# Patient Record
Sex: Male | Born: 1985 | Race: Black or African American | Hispanic: No | Marital: Single | Smoking: Current every day smoker
Health system: Southern US, Community
[De-identification: ages and names within clinical notes are randomized; demographics above are authoritative.]

## PROBLEM LIST (undated history)

## (undated) DIAGNOSIS — J45909 Unspecified asthma, uncomplicated: Secondary | ICD-10-CM

## (undated) DIAGNOSIS — L519 Erythema multiforme, unspecified: Secondary | ICD-10-CM

## (undated) DIAGNOSIS — R569 Unspecified convulsions: Secondary | ICD-10-CM

---
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
## 2006-11-20 ENCOUNTER — Emergency Department (HOSPITAL_COMMUNITY): Admission: EM | Admit: 2006-11-20 | Discharge: 2006-11-20 | Payer: Self-pay | Admitting: Emergency Medicine

## 2012-05-09 ENCOUNTER — Emergency Department (HOSPITAL_COMMUNITY)
Admission: EM | Admit: 2012-05-09 | Discharge: 2012-05-09 | Disposition: A | Discharge status: Home / Self Care | Payer: Medicaid Other | Attending: Emergency Medicine | Admitting: Emergency Medicine

## 2012-05-09 ENCOUNTER — Encounter (HOSPITAL_COMMUNITY): Payer: Self-pay | Admitting: Nurse Practitioner

## 2012-05-09 DIAGNOSIS — R21 Rash and other nonspecific skin eruption: Secondary | ICD-10-CM

## 2012-05-09 HISTORY — DX: Unspecified convulsions: R56.9

## 2012-05-09 HISTORY — DX: Unspecified asthma, uncomplicated: J45.909

## 2012-05-09 MED ORDER — DIPHENHYDRAMINE HCL 25 MG PO CAPS
25.0000 mg | ORAL_CAPSULE | Freq: Four times a day (QID) | ORAL | Status: DC | PRN
  Administered 2012-05-09: 25 mg via ORAL
  Filled 2012-05-09: qty 1

## 2012-05-09 MED ORDER — IBUPROFEN 400 MG PO TABS: 400.0000 mg | ORAL_TABLET | Freq: Once | ORAL | Status: DC

## 2012-05-09 MED ORDER — HYDROXYZINE HCL 25 MG PO TABS: 25.0000 mg | ORAL_TABLET | Freq: Four times a day (QID) | ORAL | Status: DC

## 2012-05-09 MED ORDER — ACETAMINOPHEN 325 MG PO TABS
650.0000 mg | ORAL_TABLET | Freq: Once | ORAL | Status: AC
  Administered 2012-05-09: 650 mg via ORAL
  Filled 2012-05-09: qty 2

## 2012-05-09 NOTE — Discharge Instructions (Signed)
Rash A rash is a change in the color or feel of your skin. There are many different types of rashes. You may have other problems along with your rash. HOME CARE  Avoid the thing that caused your rash.   Do not scratch your rash.   You may take cools baths to help stop itching.   Only take medicines as told by your doctor.   Keep all doctor visits as told.  GET HELP RIGHT AWAY IF:   Your pain, puffiness (swelling), or redness gets worse.   You have a fever.   You have new or severe problems.   You have body aches, watery poop (diarrhea), or you throw up (vomit).   Your rash is not better after 3 days.  MAKE SURE YOU:   Understand these instructions.   Will watch your condition.   Will get help right away if you are not doing well or get worse.  Document Released: 04/03/2008 Document Revised: 10/05/2011 Document Reviewed: 07/31/2011 [facility] Patient Information 2012 [place], [place].  RESOURCE GUIDE  Chronic Pain Problems: Contact [facility]  [phone] Patients need to be referred by their primary care doctor.  Insufficient Money for Medicine: Contact United Way:  call "211" or [facility] [phone].  No Primary Care Doctor: - Call Health Connect  [phone] - can help you locate a primary care doctor that  accepts your insurance, provides certain services, etc. - Physician Referral Service- [phone]  Agencies that provide inexpensive medical care: - [facility]  [phone] - [facility]  [phone] - [facility]  [phone] [facility]  [phone] - Planned Parenthood  [phone] [facility]  [phone]  Medicaid-accepting [facility] Providers: - [facility]- [address]  [phone], Mon-Fri 9am-7pm, Sat 9am-1pm - [facility]- [address] [place], [address] [place]  [phone] - [facility]- [address] [place]  [phone] [facility]- [address]  [phone] - Renaye Rakers- [address] [place], [address], [phone]  Only accepts [place] patients after they have their name  applied to their card  Self Pay (no insurance) in [place]: - Sickle Cell Patients: Dr Willey Blade, [facility]  [address] [place], [phone] - [facility]- [address] [place]  [phone]       [facility] [place]- [address]       -     [facility]- see information above (Speak to [employer] if you do not have insurance)       -  [facility]- [address] [place], [phone]       -  [facility]- [address],  [phone]       -  [facility]- [address], [phone]       -  Dr Julio Sicks-  [address], [place], [phone]       -  [facility]- [address], [phone]       -  [facility]- [address] [phone], also [address] [phone]       -    [facility]- [address] [place], [phone], 1st & 3rd Saturday  every month, 10am-1pm  1) Find a Doctor and Pay Out of Pocket Although you won't have to find out who is covered by your insurance plan, it is a good idea to ask around and get recommendations. You will then need to call the office and see if the doctor you have chosen will accept you as a new patient and what types of options they offer for patients who are self-pay. Some doctors offer discounts or will set up payment plans for their patients who do not have insurance, but you will need to ask so you aren't surprised when you get to your appointment.  2) Contact Your Local Health Department Not all health departments have doctors that can see patients for sick visits, but many do, so it is worth a call to see if yours does. If you don't know where your local health department is, you  can check in your phone book. The CDC also has a tool to help you locate your state's health department, and many state websites also have listings of all of their local health departments.  3) Find a Walk-in Clinic If your illness is not likely to be very severe or complicated, you may want to try a walk in clinic. These are popping up all over the country in pharmacies, drugstores, and shopping centers. They're usually staffed by nurse practitioners or physician assistants that have been trained to treat common illnesses and complaints. They're usually fairly quick and inexpensive. However, if you have serious medical issues or chronic medical problems, these are probably not your best option  STD Testing - [facility] Department of [facility] [place], STD Clinic, [address], [place], phone [phone] or [phone].  Monday - Friday, call for an appointment. [facility] Department of [employer], STD Clinic, [address], [place], phone [phone] or [phone].  Monday - Friday, call for an appointment.  Abuse/Neglect: [facility] Child Abuse Hotline 703-775-9811 [facility] Child Abuse Hotline 301 619 4996 (After Hours)  Emergency Shelter:  [facility] 612-450-9863  Maternity Homes: - [facility] (236)315-1749 - [facility] (847)[phone]  MRSA Hotline #:   2017193102  [facility] Resources  [facility] [place]  United Way [facility]. [address]                 [address]         [address] [place] [address]  Blondell Reveal Phone:  [phone]                                  Phone:  [phone]                   Phone:  [phone]  [facility], [phone] - [facility] [employer]864 [phone]        -  [facility] in [place], [address],                                  [phone], [facility] Child Abuse Hotline 214-048-9547 or [phone] (After Hours)   Behavioral Health Services  Substance Abuse Resources: - [facility] and Drug Services  (917)431-0491 - [facility] ([phone] - [facility] [place] [phone] Floydene Flock (201)398-2883 - Residential & Outpatient Substance Abuse Program  [phone]  Psychological Services: [facility]  [phone] [facility]  [phone] - [facility], [phone] [place]. [address] [place], ACCESS LINE: (442)690-7225 or [phone], [employer]za  Dental Assistance  If unable to pay or uninsured, contact:  [facility] or [facility]. to become qualified for the adult dental clinic.  Patients with Medicaid: [facility] [phone] [address], [phone] [address], [phone]  If unable to pay, or uninsured, contact [facility] [phone]) or [facility] Department [phone] in [place], [phone] in [facility]) to become qualified for the adult dental clinic  Other Low-Cost Community Dental Services: - [facility]- [address] [place], [place], [place], [address], [phone], [address]. [phone], 2nd and 4th Thursday of the month at 6:30am.  10 clients each day by appointment, can sometimes see walk-in patients if someone does not show for an appointment. [facility]- [address] [place], [place], [address], [phone] - [facility]- [address], [place], [place], [address], [phone] - [place]- [phone] [facility] Health Department- [phone] [facility] Department- [phone]

## 2012-05-09 NOTE — ED Notes (Signed)
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

## 2012-05-09 NOTE — ED Provider Notes (Signed)
History   This chart was scribed for Virgel Manifold, MD by Marin Comment . The patient was seen in room [facility].    CSN: [id number]  Arrival date & time 05/09/12  1148   First MD Initiated Contact with Patient 05/09/12 1157      Chief Complaint  Patient presents with  . Rash    (Consider location/radiation/quality/duration/timing/severity/associated sxs/prior treatment) HPI Randall Wagner is a 26 y.o. male who presents to the Emergency Department complaining of constant, moderate rash to his hands, feet, legs and knees bilaterally for the past 4 days. Patient states that his symptoms are worsening. Patient states that the rash has been intermittent for the past 3 weeks ago, with the rash subsided and then returning. 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 Pt states that he is suually treated with steroids and abx. Pt reports associated pain, nausea, chills, and diaphoresis. Patient denies any fevers. Pt denies any sores in mouth or eyes. Patient report a h/o similar symptoms and states that today's flare up is typical for his rash. Pt denies any new medications or recent abx. Pt states that his PCP had him on Vicodin for his symptoms but denies taking anything for pain recently.    Past Medical History  Diagnosis Date  . Seizures   . Asthma     History reviewed. No pertinent past surgical history.  History reviewed. No pertinent family history.  History  Substance Use Topics  . Smoking status: Never Smoker   . Smokeless tobacco: Not on file  . Alcohol Use: No      Review of Systems  Skin: Positive for rash.    Allergies  Review of patient's allergies indicates no known allergies.  Home Medications  No current outpatient prescriptions on file.  BP 142/87  Pulse 80  Temp 98.2 F (36.8 C) (Oral)  Resp 18  SpO2 100%  Physical Exam  Nursing note and vitals reviewed. Constitutional: He is oriented to person, place, and time. He  appears well-developed and well-nourished. No distress.  HENT:  Head: Normocephalic and atraumatic.  Eyes: EOM are normal.  Neck: Neck supple. No tracheal deviation present.  Cardiovascular: Normal rate, regular rhythm and normal heart sounds.   No murmur heard. Pulmonary/Chest: Effort normal and breath sounds normal. No respiratory distress. He has no wheezes.  Abdominal: Soft. Bowel sounds are normal. He exhibits no distension. There is no tenderness.  Musculoskeletal: Normal range of motion.  Neurological: He is alert and oriented to person, place, and time.  Skin: Skin is warm and dry. Rash noted.       No mucosal or eye lesions noted. Small roughly circular scattered lesions to b/l knees and shins and dorsum or R hand. Slightly raised. Nontender. No skin sloughing. Neg niklosky. NO drainage. NO evidence of cellulits.  Psychiatric: He has a normal mood and affect. His behavior is normal.    ED Course  Procedures (including critical care time)  DIAGNOSTIC STUDIES: Oxygen Saturation is 100% on room air, normal by my interpretation.    COORDINATION OF CARE:  1221: Discussed planned course of treatment with the patient, who is agreeable at this time.  1221: Medication Orders: Diphenhydramine (Benadryl) capsule 25 mg every 6 hours PRN 1230: Medication Orders: Ibuprofen (Advil, Motrin) tablet 400 mg-once.    Labs Reviewed - No data to display No results found.   1. Rash       MDM  26 year old male with rash. Gradual onset approximately 3  weeks ago. 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 Morphology of rash is consistent with this. Evidence of prior, well-healed lesions. There is no mucous membrane involvement. No ocular involvement. No recent viral symptoms prior to onset. No new medications. Plan symptomatic treatment at this time. The lesions do not appear secondarily infected. Without evidence of significant skin involvement, mucus mucous membrane involvement or  ocular do not feel that steroids or antivirals indicated at this time. Pt advised to acoid NSAID use for pain. Recommended tylenol. Pt reports previously prescribed vicodin. Given that likely benign self limited course, I do not feel requires narcotics. Return precautions were discussed.  I personally preformed the services scribed in my presence. The recorded information has been reviewed and considered. Virgel Manifold, MD.        Virgel Manifold, MD 05/09/12 [phone]

## 2012-05-10 ENCOUNTER — Emergency Department (HOSPITAL_COMMUNITY)
Admission: EM | Admit: 2012-05-10 | Discharge: 2012-05-10 | Disposition: A | Discharge status: Home / Self Care | Payer: Medicaid Other | Attending: Emergency Medicine | Admitting: Emergency Medicine

## 2012-05-10 ENCOUNTER — Emergency Department (HOSPITAL_COMMUNITY): Payer: Medicaid Other

## 2012-05-10 ENCOUNTER — Encounter (HOSPITAL_COMMUNITY): Payer: Self-pay | Admitting: Emergency Medicine

## 2012-05-10 DIAGNOSIS — K644 Residual hemorrhoidal skin tags: Secondary | ICD-10-CM | POA: Insufficient documentation

## 2012-05-10 DIAGNOSIS — R10819 Abdominal tenderness, unspecified site: Secondary | ICD-10-CM | POA: Insufficient documentation

## 2012-05-10 DIAGNOSIS — R109 Unspecified abdominal pain: Secondary | ICD-10-CM | POA: Insufficient documentation

## 2012-05-10 DIAGNOSIS — J45909 Unspecified asthma, uncomplicated: Secondary | ICD-10-CM | POA: Insufficient documentation

## 2012-05-10 DIAGNOSIS — K6289 Other specified diseases of anus and rectum: Secondary | ICD-10-CM | POA: Insufficient documentation

## 2012-05-10 DIAGNOSIS — K641 Second degree hemorrhoids: Secondary | ICD-10-CM

## 2012-05-10 DIAGNOSIS — K921 Melena: Secondary | ICD-10-CM | POA: Insufficient documentation

## 2012-05-10 DIAGNOSIS — K5900 Constipation, unspecified: Secondary | ICD-10-CM | POA: Insufficient documentation

## 2012-05-10 LAB — COMPREHENSIVE METABOLIC PANEL
ALT: 20 U/L (ref 0–53)
Albumin: 3.8 g/dL (ref 3.5–5.2)
Alkaline Phosphatase: 84 U/L (ref 39–117)
BUN: 9 mg/dL (ref 6–23)
CO2: 22 mEq/L (ref 19–32)
Glucose, Bld: 102 mg/dL — ABNORMAL HIGH (ref 70–99)
Total Bilirubin: 0.1 mg/dL — ABNORMAL LOW (ref 0.3–1.2)
Total Protein: 7.4 g/dL (ref 6.0–8.3)

## 2012-05-10 LAB — CBC WITH DIFFERENTIAL/PLATELET
Basophils Absolute: 0.1 10*3/uL (ref 0.0–0.1)
Eosinophils Absolute: 0.2 10*3/uL (ref 0.0–0.7)
Eosinophils Relative: 2 % (ref 0–5)
HCT: 35 % — ABNORMAL LOW (ref 39.0–52.0)
Lymphocytes Relative: 39 % (ref 12–46)
MCH: 24.4 pg — ABNORMAL LOW (ref 26.0–34.0)
MCV: 74.2 fL — ABNORMAL LOW (ref 78.0–100.0)
Monocytes Absolute: 1 10*3/uL (ref 0.1–1.0)
RDW: 18.5 % — ABNORMAL HIGH (ref 11.5–15.5)
WBC: 8.8 10*3/uL (ref 4.0–10.5)

## 2012-05-10 LAB — PROTIME-INR: Prothrombin Time: 13.8 seconds (ref 11.6–15.2)

## 2012-05-10 MED ORDER — HYDROCODONE-ACETAMINOPHEN 5-325 MG PO TABS
2.0000 | ORAL_TABLET | Freq: Once | ORAL | Status: AC
  Administered 2012-05-10: 2 via ORAL
  Filled 2012-05-10: qty 2

## 2012-05-10 MED ORDER — ANUSOL-HC 2.5 % RE CREA: TOPICAL_CREAM | RECTAL | Status: AC

## 2012-05-10 MED ORDER — IOHEXOL 300 MG/ML  SOLN
20.0000 mL | INTRAMUSCULAR | Status: DC
  Administered 2012-05-10: 20 mL via ORAL

## 2012-05-10 MED ORDER — SODIUM CHLORIDE 0.9 % IV BOLUS (SEPSIS)
1000.0000 mL | Freq: Once | INTRAVENOUS | Status: AC
  Administered 2012-05-10: 1000 mL via INTRAVENOUS

## 2012-05-10 MED ORDER — HYDROMORPHONE HCL PF 1 MG/ML IJ SOLN
1.0000 mg | Freq: Once | INTRAMUSCULAR | Status: AC
  Administered 2012-05-10: 1 mg via INTRAVENOUS
  Filled 2012-05-10: qty 1

## 2012-05-10 MED ORDER — IOHEXOL 300 MG/ML  SOLN
80.0000 mL | Freq: Once | INTRAMUSCULAR | Status: AC | PRN
  Administered 2012-05-10: 80 mL via INTRAVENOUS

## 2012-05-10 MED ORDER — DOCUSATE SODIUM 100 MG PO CAPS: 100.0000 mg | ORAL_CAPSULE | Freq: Two times a day (BID) | ORAL | Status: AC

## 2012-05-10 NOTE — ED Notes (Signed)
Pt c/o rectal pain and bleeding when wiping intermittently x 1 year worse over last couple of days

## 2012-05-10 NOTE — ED Provider Notes (Signed)
History     CSN: [id number]  Arrival date & time 05/10/12  1207   First MD Initiated Contact with Patient 05/10/12 1227      Chief Complaint  Patient presents with  . Rectal Pain    (Consider location/radiation/quality/duration/timing/severity/associated sxs/prior treatment) HPI Comments: Young man with no medical hx comes in with cc of abd pain and rectal pain. Pt has had some rectal pain for few months, that has gotten significantly worse over the past few weeks. Pt's pain is constant, worse with defecation. There is also rectal bleed, mostly bright red, and sometimes he has noticed prolapsing mucosa. No purulent discharge.  Pt has lower quadrant abd pain in the LLQ and suprapubic region. He has no UTI symptoms and denies any hx of STD or penile discharge. Pt denies any anal sex. There is family hx of ulcerative colitis, and he admits to about 15 lbs weight loss in 2 months.  The history is provided by the patient.    Past Medical History  Diagnosis Date  . Seizures   . Asthma     History reviewed. No pertinent past surgical history.  History reviewed. No pertinent family history.  History  Substance Use Topics  . Smoking status: Never Smoker   . Smokeless tobacco: Not on file  . Alcohol Use: No      Review of Systems  Constitutional: Negative for fever, chills and activity change.  HENT: Negative for neck pain.   Eyes: Negative for visual disturbance.  Respiratory: Negative for cough, chest tightness and shortness of breath.   Cardiovascular: Negative for chest pain.  Gastrointestinal: Positive for abdominal pain, constipation, blood in stool, anal bleeding and rectal pain. Negative for nausea, vomiting, diarrhea and abdominal distention.  Genitourinary: Negative for dysuria, flank pain, enuresis and difficulty urinating.  Musculoskeletal: Negative for arthralgias.  Neurological: Negative for dizziness, light-headedness and headaches.  Psychiatric/Behavioral:  Negative for confusion.    Allergies  Review of patient's allergies indicates no known allergies.  Home Medications  No current outpatient prescriptions on file.  BP 139/74  Pulse 72  Temp 98 F (36.7 C) (Oral)  Resp 18  Physical Exam  Constitutional: He is oriented to person, place, and time. He appears well-developed.  HENT:  Head: Normocephalic and atraumatic.  Eyes: Conjunctivae and EOM are normal. Pupils are equal, round, and reactive to light.  Neck: Normal range of motion. Neck supple.  Cardiovascular: Normal rate, regular rhythm and normal heart sounds.   Pulmonary/Chest: Effort normal and breath sounds normal. No respiratory distress. He has no wheezes.  Abdominal: Soft. Bowel sounds are normal. He exhibits no distension. There is tenderness. There is guarding. There is no rebound.       Pt has external hemorrhoid, no purulence, no thrombosis, and some fissuring in the anus. DRE is tender, non melanotic, but guaic + stools.  Neurological: He is alert and oriented to person, place, and time.  Skin: Skin is warm.    ED Course  Procedures (including critical care time)  Labs Reviewed  CBC WITH DIFFERENTIAL - Abnormal; Notable for the following:    Hemoglobin 11.5 (*)     HCT 35.0 (*)     MCV 74.2 (*)     MCH 24.4 (*)     RDW 18.5 (*)     All other components within normal limits  COMPREHENSIVE METABOLIC PANEL - Abnormal; Notable for the following:    Glucose, Bld 102 (*)     AST 46 (*)  Total Bilirubin 0.1 (*)     All other components within normal limits  PROTIME-INR  APTT   No results found.   No diagnosis found.    MDM  Young male comes in with rectal pain, bleed and lower quadrant abd pain. Exam consistent with external hemorrhoids. Based on the hx, there is concern for IBD. I cannot explain the lower quadrant pain either. Will get CBC, and some pain meds. If WC elevated, or pain is not controlled, we will get CT abd.        Derwood Kaplan, MD 05/10/12 2043

## 2012-05-10 NOTE — ED Notes (Signed)
Pt states he has had pain in his rectum for a year and has had bleeding also  X 1 year states was incarcerated and got out in march. Pt staes that jail did not do a lot for him for this issue

## 2013-04-01 ENCOUNTER — Emergency Department (HOSPITAL_BASED_OUTPATIENT_CLINIC_OR_DEPARTMENT_OTHER)
Admission: EM | Admit: 2013-04-01 | Discharge: 2013-04-01 | Disposition: A | Discharge status: Home / Self Care | Payer: Medicaid Other | Attending: Emergency Medicine | Admitting: Emergency Medicine

## 2013-04-01 ENCOUNTER — Encounter (HOSPITAL_BASED_OUTPATIENT_CLINIC_OR_DEPARTMENT_OTHER): Payer: Self-pay

## 2013-04-01 DIAGNOSIS — Z8669 Personal history of other diseases of the nervous system and sense organs: Secondary | ICD-10-CM | POA: Insufficient documentation

## 2013-04-01 DIAGNOSIS — J45909 Unspecified asthma, uncomplicated: Secondary | ICD-10-CM | POA: Insufficient documentation

## 2013-04-01 DIAGNOSIS — Z872 Personal history of diseases of the skin and subcutaneous tissue: Secondary | ICD-10-CM | POA: Insufficient documentation

## 2013-04-01 DIAGNOSIS — F17200 Nicotine dependence, unspecified, uncomplicated: Secondary | ICD-10-CM | POA: Insufficient documentation

## 2013-04-01 DIAGNOSIS — R21 Rash and other nonspecific skin eruption: Secondary | ICD-10-CM | POA: Insufficient documentation

## 2013-04-01 HISTORY — DX: Erythema multiforme, unspecified: L51.9

## 2013-04-01 MED ORDER — HYDROCODONE-ACETAMINOPHEN 7.5-325 MG PO TABS: 1.0000 | ORAL_TABLET | Freq: Four times a day (QID) | ORAL | Status: DC | PRN

## 2013-04-01 MED ORDER — PREDNISONE 10 MG PO TABS
ORAL_TABLET | ORAL | Status: AC
  Filled 2013-04-01: qty 1

## 2013-04-01 MED ORDER — HYDROCODONE-ACETAMINOPHEN 5-325 MG PO TABS
2.0000 | ORAL_TABLET | Freq: Once | ORAL | Status: AC
  Administered 2013-04-01: 2 via ORAL
  Filled 2013-04-01: qty 2

## 2013-04-01 MED ORDER — PREDNISONE 50 MG PO TABS
60.0000 mg | ORAL_TABLET | Freq: Every day | ORAL | Status: DC
  Administered 2013-04-01: 60 mg via ORAL

## 2013-04-01 MED ORDER — PREDNISONE 10 MG PO TABS: ORAL_TABLET | ORAL | Status: DC

## 2013-04-01 MED ORDER — PREDNISONE 50 MG PO TABS
ORAL_TABLET | ORAL | Status: AC
  Filled 2013-04-01: qty 1

## 2013-04-01 NOTE — Discharge Instructions (Signed)

## 2013-04-01 NOTE — ED Notes (Addendum)
C/o painful rash x 4 days-reports hx of erythema multiforme-pt requests "percocet or vicodin 7.5 and steroids"

## 2013-04-01 NOTE — ED Provider Notes (Signed)
Medical screening examination/treatment/procedure(s) were performed by non-physician practitioner and as supervising physician I was immediately available for consultation/collaboration.   Gavin Pound. Isao Seltzer, MD 04/01/13 1507

## 2013-04-01 NOTE — ED Provider Notes (Signed)
History     CSN: [id number]  Arrival date & time 04/01/13  1151   First MD Initiated Contact with Patient 04/01/13 1251      Chief Complaint  Patient presents with  . Rash    (Consider location/radiation/quality/duration/timing/severity/associated sxs/prior treatment) Patient is a 27 y.o. male presenting with rash. The history is provided by the patient. No language interpreter was used.  Rash Pain location:  Generalized Pain radiates to:  Does not radiate Pain severity:  Moderate Timing:  Constant Progression:  Worsening Relieved by:  Nothing Worsened by:  Nothing tried Risk factors: no alcohol abuse   Pt complains of a rash full body,   Pt reports he has had erythema multiforme in the past.   Pt complains of pain from rash  Past Medical History  Diagnosis Date  . Seizures   . Asthma   . Erythema multiforme     History reviewed. No pertinent past surgical history.  No family history on file.  History  Substance Use Topics  . Smoking status: Current Every Day Smoker  . Smokeless tobacco: Not on file  . Alcohol Use: No      Review of Systems  Skin: Positive for rash.  All other systems reviewed and are negative.    Allergies  Review of patient's allergies indicates no known allergies.  Home Medications  No current outpatient prescriptions on file.  BP 128/82  Pulse 106  Temp(Src) 98.2 F (36.8 C) (Oral)  Resp 20  SpO2 98%  Physical Exam  Nursing note and vitals reviewed. Constitutional: He is oriented to person, place, and time. He appears well-developed and well-nourished.  HENT:  Head: Normocephalic.  Eyes: Pupils are equal, round, and reactive to light.  Neck: Normal range of motion.  Pulmonary/Chest: Effort normal.  Abdominal: Soft.  Musculoskeletal: Normal range of motion.  Neurological: He is alert and oriented to person, place, and time.  Skin: Rash noted. There is erythema.  Round raised scaly areas,  (looks more fungal)    Psychiatric: He has a normal mood and affect.    ED Course  Procedures (including critical care time)  Labs Reviewed - No data to display No results found.   1. Rash       MDM  Pt reports rash has responded to prednisone in the past.   I do not think this is erythema multi forme.  I advised pt to see dermatologist.   Primary care referrals given        Fransico Meadow, PA-C 04/01/13 [place], [place] 04/01/13 1321

## 2016-01-21 ENCOUNTER — Encounter (HOSPITAL_BASED_OUTPATIENT_CLINIC_OR_DEPARTMENT_OTHER): Payer: Self-pay | Admitting: *Deleted

## 2016-01-21 ENCOUNTER — Emergency Department (HOSPITAL_BASED_OUTPATIENT_CLINIC_OR_DEPARTMENT_OTHER)
Admission: EM | Admit: 2016-01-21 | Discharge: 2016-01-21 | Disposition: A | Discharge status: Home / Self Care | Payer: Self-pay | Attending: Emergency Medicine | Admitting: Emergency Medicine

## 2016-01-21 ENCOUNTER — Emergency Department (HOSPITAL_BASED_OUTPATIENT_CLINIC_OR_DEPARTMENT_OTHER): Payer: Self-pay

## 2016-01-21 DIAGNOSIS — X58XXXA Exposure to other specified factors, initial encounter: Secondary | ICD-10-CM | POA: Insufficient documentation

## 2016-01-21 DIAGNOSIS — Y9383 Activity, rough housing and horseplay: Secondary | ICD-10-CM | POA: Insufficient documentation

## 2016-01-21 DIAGNOSIS — S63602A Unspecified sprain of left thumb, initial encounter: Secondary | ICD-10-CM | POA: Insufficient documentation

## 2016-01-21 DIAGNOSIS — Z87891 Personal history of nicotine dependence: Secondary | ICD-10-CM | POA: Insufficient documentation

## 2016-01-21 DIAGNOSIS — Y998 Other external cause status: Secondary | ICD-10-CM | POA: Insufficient documentation

## 2016-01-21 DIAGNOSIS — Z872 Personal history of diseases of the skin and subcutaneous tissue: Secondary | ICD-10-CM | POA: Insufficient documentation

## 2016-01-21 DIAGNOSIS — Y9289 Other specified places as the place of occurrence of the external cause: Secondary | ICD-10-CM | POA: Insufficient documentation

## 2016-01-21 DIAGNOSIS — J45909 Unspecified asthma, uncomplicated: Secondary | ICD-10-CM | POA: Insufficient documentation

## 2016-01-21 MED ORDER — IBUPROFEN 800 MG PO TABS: 800.0000 mg | ORAL_TABLET | Freq: Three times a day (TID) | ORAL | Status: AC

## 2016-01-21 MED ORDER — HYDROCODONE-ACETAMINOPHEN 5-325 MG PO TABS
1.0000 | ORAL_TABLET | Freq: Once | ORAL | Status: AC
  Administered 2016-01-21: 1 via ORAL
  Filled 2016-01-21: qty 1

## 2016-01-21 NOTE — ED Notes (Signed)
Ice pack given

## 2016-01-21 NOTE — ED Provider Notes (Signed)
CSN: [id number]     Arrival date & time 01/21/16  0847 History   First MD Initiated Contact with Patient 01/21/16 0901     No chief complaint on file.    (Consider location/radiation/quality/duration/timing/severity/associated sxs/prior Treatment) HPI   30 year old male with history of seizure presents for evaluation of left thumb injury. Patient report 2 days ago he was horse playing with his nephew when he thinks he may have injured his left thumb in the process. He felt that he may have hyperextended the thumb and since then he has noticed swelling along with 8 out of 10 sharp throbbing nonradiating pain to his thumb worsening with movement. He has tried taking Tylenol and ibuprofen with minimal improvement. He denies any fever, wrist pain, elbow pain, or pain to the remaining fingers. He is left-hand dominant. No numbness or weakness.  Past Medical History  Diagnosis Date  . Seizures ([place])   . Asthma   . Erythema multiforme    History reviewed. No pertinent past surgical history. No family history on file. Social History  Substance Use Topics  . Smoking status: Former Research scientist (life sciences)  . Smokeless tobacco: None  . Alcohol Use: No    Review of Systems  Constitutional: Negative for fever.  Musculoskeletal: Positive for joint swelling.  Skin: Negative for wound.  Neurological: Negative for numbness.      Allergies  Review of patient's allergies indicates no known allergies.  Home Medications   Prior to Admission medications   Not on File   BP 125/84 mmHg  Pulse 82  Temp(Src) 98.6 F (37 C) (Oral)  Resp 18  Ht 6' (1.829 m)  Wt 104.327 kg  BMI 31.19 kg/m2  SpO2 98% Physical Exam  Constitutional: He appears well-developed and well-nourished. No distress.  HENT:  Head: Atraumatic.  Eyes: Conjunctivae are normal.  Neck: Neck supple.  Musculoskeletal: He exhibits tenderness (Left hand: Tenderness along the entire thumb most significant to first MCP on palpation with mild  swelling noted. No gross deformity or laceration. Brisk cap refill. Decrease movement to IP and MCP joint secondary to pain. No pain at the anatomical snuffbo).  Neurological: He is alert.  Skin: No rash noted.  Psychiatric: He has a normal mood and affect.  Nursing note and vitals reviewed.   ED Course  Procedures (including critical care time) Labs Review Labs Reviewed - No data to display  Imaging Review Dg Finger Thumb Left  01/21/2016  CLINICAL DATA:  Pain in MCP joint region.  Unsure trauma history. EXAM: LEFT THUMB 2+V COMPARISON:  None. FINDINGS: Frontal, oblique, and lateral views were obtained. There is no demonstrable fracture or dislocation. The joint spaces appear normal. No erosive change. IMPRESSION: No demonstrable fracture or dislocation. No appreciable arthropathic change. Electronically Signed   By: Lowella Grip III M.D.   On: 01/21/2016 09:15   I have personally reviewed and evaluated these images and lab results as part of my medical decision-making.   EKG Interpretation None      MDM   Final diagnoses:  Left thumb sprain, initial encounter    BP 125/84 mmHg  Pulse 82  Temp(Src) 98.6 F (37 C) (Oral)  Resp 18  Ht 6' (1.829 m)  Wt 104.327 kg  BMI 31.19 kg/m2  SpO2 98%  9:22 AM Patient injured left thumb. Thumb is moderately swollen however x-ray shows no acute fractures or dislocation. This is likely a sprain. Ace wrap provided, rice therapy discussed, return precautions discussed.    Domenic Moras, PA-C  01/21/16 [place], MD 01/21/16 0930

## 2016-01-21 NOTE — ED Notes (Signed)
States he was horseplaying on Wednesday and  Now had pain in left thumb and swelling. NL pulses.

## 2016-01-21 NOTE — Discharge Instructions (Signed)
Finger Sprain A finger sprain is a tear in one of the strong, fibrous tissues that connect the bones (ligaments) in your finger. The severity of the sprain depends on how much of the ligament is torn. The tear can be either partial or complete. CAUSES  Often, sprains are a result of a fall or accident. If you extend your hands to catch an object or to protect yourself, the force of the impact causes the fibers of your ligament to stretch too much. This excess tension causes the fibers of your ligament to tear. SYMPTOMS  You may have some loss of motion in your finger. Other symptoms include:  Bruising.  Tenderness.  Swelling. DIAGNOSIS  In order to diagnose finger sprain, your caregiver will physically examine your finger or thumb to determine how torn the ligament is. Your caregiver may also suggest an X-ray exam of your finger to make sure no bones are broken. TREATMENT  If your ligament is only partially torn, treatment usually involves keeping the finger in a fixed position (immobilization) for a short period. To do this, your caregiver will apply a bandage, cast, or splint to keep your finger from moving until it heals. For a partially torn ligament, the healing process usually takes 2 to 3 weeks. If your ligament is completely torn, you may need surgery to reconnect the ligament to the bone. After surgery a cast or splint will be applied and will need to stay on your finger or thumb for 4 to 6 weeks while your ligament heals. HOME CARE INSTRUCTIONS  Keep your injured finger elevated, when possible, to decrease swelling.  To ease pain and swelling, apply ice to your joint twice a day, for 2 to 3 days:  Put ice in a plastic bag.  Place a towel between your skin and the bag.  Leave the ice on for 15 minutes.  Only take over-the-counter or prescription medicine for pain as directed by your caregiver.  Do not wear rings on your injured finger.  Do not leave your finger unprotected  until pain and stiffness go away (usually 3 to 4 weeks).  Do not allow your cast or splint to get wet. Cover your cast or splint with a plastic bag when you shower or bathe. Do not swim.  Your caregiver may suggest special exercises for you to do during your recovery to prevent or limit permanent stiffness. SEEK IMMEDIATE MEDICAL CARE IF:  Your cast or splint becomes damaged.  Your pain becomes worse rather than better. MAKE SURE YOU:  Understand these instructions.  Will watch your condition.  Will get help right away if you are not doing well or get worse.   This information is not intended to replace advice given to you by your health care provider. Make sure you discuss any questions you have with your health care provider.   Document Released: 11/23/2004 Document Revised: 11/06/2014 Document Reviewed: 06/19/2011 Elsevier Interactive Patient Education 2016 [employer]  

## 2016-02-03 ENCOUNTER — Emergency Department (HOSPITAL_COMMUNITY): Payer: Self-pay

## 2016-02-03 ENCOUNTER — Encounter (HOSPITAL_COMMUNITY): Payer: Self-pay | Admitting: Emergency Medicine

## 2016-02-03 ENCOUNTER — Emergency Department (HOSPITAL_COMMUNITY)
Admission: EM | Admit: 2016-02-03 | Discharge: 2016-02-03 | Disposition: A | Discharge status: Home / Self Care | Payer: Self-pay | Attending: Emergency Medicine | Admitting: Emergency Medicine

## 2016-02-03 DIAGNOSIS — S51812A Laceration without foreign body of left forearm, initial encounter: Secondary | ICD-10-CM | POA: Insufficient documentation

## 2016-02-03 DIAGNOSIS — Y9289 Other specified places as the place of occurrence of the external cause: Secondary | ICD-10-CM | POA: Insufficient documentation

## 2016-02-03 DIAGNOSIS — T148XXA Other injury of unspecified body region, initial encounter: Secondary | ICD-10-CM

## 2016-02-03 DIAGNOSIS — S0990XA Unspecified injury of head, initial encounter: Secondary | ICD-10-CM | POA: Insufficient documentation

## 2016-02-03 DIAGNOSIS — Y999 Unspecified external cause status: Secondary | ICD-10-CM | POA: Insufficient documentation

## 2016-02-03 DIAGNOSIS — Y9389 Activity, other specified: Secondary | ICD-10-CM | POA: Insufficient documentation

## 2016-02-03 DIAGNOSIS — S21112A Laceration without foreign body of left front wall of thorax without penetration into thoracic cavity, initial encounter: Secondary | ICD-10-CM | POA: Insufficient documentation

## 2016-02-03 DIAGNOSIS — F17200 Nicotine dependence, unspecified, uncomplicated: Secondary | ICD-10-CM | POA: Insufficient documentation

## 2016-02-03 DIAGNOSIS — R55 Syncope and collapse: Secondary | ICD-10-CM | POA: Insufficient documentation

## 2016-02-03 LAB — PREPARE FRESH FROZEN PLASMA
UNIT DIVISION: 0
Unit division: 0

## 2016-02-03 LAB — COMPREHENSIVE METABOLIC PANEL
ALT: 28 U/L (ref 17–63)
AST: 27 U/L (ref 15–41)
Albumin: 3.8 g/dL (ref 3.5–5.0)
Alkaline Phosphatase: 91 U/L (ref 38–126)
Anion gap: 11 (ref 5–15)
BUN: 8 mg/dL (ref 6–20)
CHLORIDE: 109 mmol/L (ref 101–111)
CO2: 22 mmol/L (ref 22–32)
CREATININE: 0.98 mg/dL (ref 0.61–1.24)
Calcium: 9 mg/dL (ref 8.9–10.3)
Glucose, Bld: 89 mg/dL (ref 65–99)
POTASSIUM: 3.3 mmol/L — AB (ref 3.5–5.1)
Sodium: 142 mmol/L (ref 135–145)
Total Bilirubin: 0.7 mg/dL (ref 0.3–1.2)
Total Protein: 7.1 g/dL (ref 6.5–8.1)

## 2016-02-03 LAB — I-STAT CHEM 8, ED
BUN: 7 mg/dL (ref 6–20)
CALCIUM ION: 1.12 mmol/L (ref 1.12–1.23)
CHLORIDE: 105 mmol/L (ref 101–111)
Creatinine, Ser: 0.9 mg/dL (ref 0.61–1.24)
Glucose, Bld: 88 mg/dL (ref 65–99)
HCT: 46 % (ref 39.0–52.0)
HEMOGLOBIN: 15.6 g/dL (ref 13.0–17.0)
Potassium: 3.2 mmol/L — ABNORMAL LOW (ref 3.5–5.1)
SODIUM: 144 mmol/L (ref 135–145)
TCO2: 23 mmol/L (ref 0–100)

## 2016-02-03 LAB — ABO/RH: ABO/RH(D): O POS

## 2016-02-03 LAB — CBC
HCT: 42.7 % (ref 39.0–52.0)
Hemoglobin: 15 g/dL (ref 13.0–17.0)
MCH: 30.1 pg (ref 26.0–34.0)
MCHC: 35.1 g/dL (ref 30.0–36.0)
MCV: 85.7 fL (ref 78.0–100.0)
PLATELETS: 241 10*3/uL (ref 150–400)
RBC: 4.98 MIL/uL (ref 4.22–5.81)
RDW: 12.5 % (ref 11.5–15.5)
WBC: 10.1 10*3/uL (ref 4.0–10.5)

## 2016-02-03 LAB — PROTIME-INR
INR: 1.07 (ref 0.00–1.49)
PROTHROMBIN TIME: 14.1 s (ref 11.6–15.2)

## 2016-02-03 LAB — I-STAT CG4 LACTIC ACID, ED: LACTIC ACID, VENOUS: 1.99 mmol/L (ref 0.5–2.0)

## 2016-02-03 LAB — ETHANOL: Alcohol, Ethyl (B): 70 mg/dL — ABNORMAL HIGH (ref <5)

## 2016-02-03 MED ORDER — FENTANYL CITRATE (PF) 100 MCG/2ML IJ SOLN
INTRAMUSCULAR | Status: AC
  Filled 2016-02-03: qty 2

## 2016-02-03 MED ORDER — FENTANYL CITRATE (PF) 100 MCG/2ML IJ SOLN
50.0000 ug | Freq: Once | INTRAMUSCULAR | Status: AC
  Administered 2016-02-03: 50 ug via INTRAVENOUS

## 2016-02-03 MED ORDER — LIDOCAINE HCL (PF) 1 % IJ SOLN
20.0000 mL | Freq: Once | INTRAMUSCULAR | Status: AC
  Administered 2016-02-03: 20 mL via INTRADERMAL
  Filled 2016-02-03: qty 20
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
## 2016-02-03 NOTE — ED Notes (Signed)
[place] to bedside

## 2016-02-03 NOTE — ED Notes (Signed)
Vital signs stable. 

## 2016-02-03 NOTE — ED Notes (Signed)
PD finished with interview
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
## 2016-02-03 NOTE — ED Notes (Signed)
Patient transported to CT 

## 2016-02-03 NOTE — ED Notes (Signed)
PD remains at bedside.

## 2016-02-04 ENCOUNTER — Encounter (HOSPITAL_BASED_OUTPATIENT_CLINIC_OR_DEPARTMENT_OTHER): Payer: Self-pay | Admitting: *Deleted

## 2016-02-04 LAB — TYPE AND SCREEN
ABO/RH(D): O POS
Antibody Screen: NEGATIVE
UNIT DIVISION: 0
Unit division: 0

## 2016-02-04 LAB — CDS SEROLOGY
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
